# Patient Record
Sex: Female | Born: 1970 | Race: White | Hispanic: No | Marital: Married | Smoking: Never smoker
Health system: Southern US, Community
[De-identification: ages and names within clinical notes are randomized; demographics above are authoritative.]

---

## 1997-02-15 ENCOUNTER — Encounter (HOSPITAL_COMMUNITY): Admission: RE | Admit: 1997-02-15 | Discharge: 1997-03-24 | Payer: Self-pay | Admitting: Obstetrics & Gynecology

## 1997-11-21 ENCOUNTER — Emergency Department (HOSPITAL_COMMUNITY): Admission: EM | Admit: 1997-11-21 | Discharge: 1997-11-21 | Payer: Self-pay | Admitting: Emergency Medicine

## 1998-12-28 ENCOUNTER — Other Ambulatory Visit: Admission: RE | Admit: 1998-12-28 | Discharge: 1998-12-28 | Payer: Self-pay | Admitting: Obstetrics & Gynecology

## 2000-02-14 ENCOUNTER — Other Ambulatory Visit: Admission: RE | Admit: 2000-02-14 | Discharge: 2000-02-14 | Payer: Self-pay | Admitting: Obstetrics & Gynecology

## 2000-09-24 ENCOUNTER — Inpatient Hospital Stay (HOSPITAL_COMMUNITY): Admission: AD | Admit: 2000-09-24 | Discharge: 2000-09-24 | Payer: Self-pay | Admitting: Obstetrics and Gynecology

## 2000-09-30 ENCOUNTER — Other Ambulatory Visit: Admission: RE | Admit: 2000-09-30 | Discharge: 2000-09-30 | Payer: Self-pay | Admitting: Obstetrics & Gynecology

## 2001-04-18 ENCOUNTER — Inpatient Hospital Stay (HOSPITAL_COMMUNITY): Admission: AD | Admit: 2001-04-18 | Discharge: 2001-04-18 | Payer: Self-pay | Admitting: Obstetrics and Gynecology

## 2001-04-23 ENCOUNTER — Inpatient Hospital Stay (HOSPITAL_COMMUNITY): Admission: AD | Admit: 2001-04-23 | Discharge: 2001-04-25 | Payer: Self-pay | Admitting: Obstetrics and Gynecology

## 2001-06-24 ENCOUNTER — Other Ambulatory Visit: Admission: RE | Admit: 2001-06-24 | Discharge: 2001-06-24 | Payer: Self-pay | Admitting: Obstetrics & Gynecology

## 2003-11-26 ENCOUNTER — Encounter: Admission: RE | Admit: 2003-11-26 | Discharge: 2003-11-26 | Payer: Self-pay | Admitting: Internal Medicine

## 2004-04-13 ENCOUNTER — Other Ambulatory Visit: Admission: RE | Admit: 2004-04-13 | Discharge: 2004-04-13 | Payer: Self-pay | Admitting: Obstetrics & Gynecology

## 2006-12-08 ENCOUNTER — Inpatient Hospital Stay (HOSPITAL_COMMUNITY): Admission: AD | Admit: 2006-12-08 | Discharge: 2006-12-08 | Payer: Self-pay | Admitting: Obstetrics and Gynecology

## 2007-02-19 ENCOUNTER — Inpatient Hospital Stay (HOSPITAL_COMMUNITY): Admission: RE | Admit: 2007-02-19 | Discharge: 2007-02-21 | Payer: Self-pay | Admitting: Obstetrics and Gynecology

## 2008-03-12 ENCOUNTER — Ambulatory Visit: Payer: Self-pay | Admitting: Gastroenterology

## 2008-03-12 DIAGNOSIS — K589 Irritable bowel syndrome without diarrhea: Secondary | ICD-10-CM | POA: Insufficient documentation

## 2008-03-12 DIAGNOSIS — R079 Chest pain, unspecified: Secondary | ICD-10-CM | POA: Insufficient documentation

## 2008-03-12 DIAGNOSIS — R1310 Dysphagia, unspecified: Secondary | ICD-10-CM | POA: Insufficient documentation

## 2008-03-12 DIAGNOSIS — I059 Rheumatic mitral valve disease, unspecified: Secondary | ICD-10-CM | POA: Insufficient documentation

## 2008-03-19 ENCOUNTER — Ambulatory Visit: Payer: Self-pay | Admitting: Gastroenterology

## 2010-02-04 ENCOUNTER — Encounter: Payer: Self-pay | Admitting: Internal Medicine

## 2010-10-06 LAB — CBC
HCT: 33.5 — ABNORMAL LOW
HCT: 37.5
Hemoglobin: 11.6 — ABNORMAL LOW
Hemoglobin: 13.1
MCHC: 34.7
MCHC: 34.9
MCV: 89.2
MCV: 89.5
Platelets: 209
Platelets: 213
RBC: 3.74 — ABNORMAL LOW
RBC: 4.2
RDW: 14
RDW: 14.2
WBC: 10.7 — ABNORMAL HIGH
WBC: 15.5 — ABNORMAL HIGH

## 2010-10-06 LAB — RPR: RPR Ser Ql: NONREACTIVE

## 2010-10-24 LAB — FETAL FIBRONECTIN: Fetal Fibronectin: NEGATIVE

## 2014-08-02 ENCOUNTER — Encounter: Payer: Self-pay | Admitting: Gastroenterology

## 2015-12-09 ENCOUNTER — Ambulatory Visit (HOSPITAL_COMMUNITY)
Admission: EM | Admit: 2015-12-09 | Discharge: 2015-12-09 | Disposition: A | Discharge status: Home / Self Care | Payer: BLUE CROSS/BLUE SHIELD | Attending: Emergency Medicine | Admitting: Emergency Medicine

## 2015-12-09 ENCOUNTER — Ambulatory Visit (INDEPENDENT_AMBULATORY_CARE_PROVIDER_SITE_OTHER): Payer: BLUE CROSS/BLUE SHIELD

## 2015-12-09 ENCOUNTER — Encounter (HOSPITAL_COMMUNITY): Payer: Self-pay

## 2015-12-09 DIAGNOSIS — S4352XA Sprain of left acromioclavicular joint, initial encounter: Secondary | ICD-10-CM | POA: Diagnosis not present

## 2015-12-09 MED ORDER — HYDROCODONE-ACETAMINOPHEN 5-325 MG PO TABS: 1.0000 | ORAL_TABLET | ORAL | 0 refills | Status: DC | PRN

## 2015-12-09 MED ORDER — CYCLOBENZAPRINE HCL 5 MG PO TABS: 5.0000 mg | ORAL_TABLET | Freq: Three times a day (TID) | ORAL | 0 refills | Status: DC | PRN

## 2015-12-09 MED ORDER — MELOXICAM 15 MG PO TABS: 15.0000 mg | ORAL_TABLET | Freq: Every day | ORAL | 0 refills | Status: AC

## 2015-12-09 NOTE — ED Provider Notes (Signed)
[facility]    CSN: [id number] Arrival date & time: 12/09/15  1102     History   Chief Complaint Chief Complaint  Patient presents with  . Fall    HPI Janice Delgado is a 45 y.o. female.   HPI She is a 45 year old woman here for evaluation of left shoulder injury. She is playing tag football last night when she fell. She states she almost flipped over. She landed on her left shoulder. She describes a forceful stretch injury to the left lateral neck. Initially, she had pains shooting down her arm and hand, but these have resolved. She is able to move her hand and wrist without difficulty. She is unable to move her elbow or shoulder without pain. She states moving the shoulder causes a crunching sound. She reports pain in the anterior superior and posterior shoulder. She is currently wearing a sling.  History reviewed. No pertinent past medical history.  Patient Active Problem List   Diagnosis Date Noted  . MITRAL VALVE PROLAPSE 03/12/2008  . IRRITABLE BOWEL SYNDROME 03/12/2008  . CHEST PAIN 03/12/2008  . DYSPHAGIA UNSPECIFIED 03/12/2008    History reviewed. No pertinent surgical history.  OB History    No data available       Home Medications    Prior to Admission medications   Medication Sig Start Date End Date Taking? Authorizing Provider  cyclobenzaprine (FLEXERIL) 5 MG tablet Take 1 tablet (5 mg total) by mouth 3 (three) times daily as needed for muscle spasms. 12/09/15   Charm RingsErin J Milessa Hogan, MD  HYDROcodone-acetaminophen (NORCO) 5-325 MG tablet Take 1 tablet by mouth every 4 (four) hours as needed for moderate pain. 12/09/15   Charm RingsErin J Lillyana Majette, MD  meloxicam (MOBIC) 15 MG tablet Take 1 tablet (15 mg total) by mouth daily. 12/09/15   Charm RingsErin J Carola Viramontes, MD    Family History No family history on file.  Social History Social History  Substance Use Topics  . Smoking status: Never Smoker  . Smokeless tobacco: Never Used  . Alcohol use No     Allergies   Patient  has no known allergies.   Review of Systems Review of Systems As in history of present illness  Physical Exam Triage Vital Signs ED Triage Vitals  Enc Vitals Group     BP 12/09/15 1147 127/70     Pulse Rate 12/09/15 1147 87     Resp 12/09/15 1147 16     Temp 12/09/15 1147 98.4 F (36.9 C)     Temp Source 12/09/15 1147 Oral     SpO2 12/09/15 1147 100 %     Weight --      Height --      Head Circumference --      Peak Flow --      Pain Score 12/09/15 1151 6     Pain Loc --      Pain Edu? --      Excl. in [facility]? --    No data found.   Updated Vital Signs BP 127/70 (BP Location: Right Arm)   Pulse 87   Temp 98.4 F (36.9 C) (Oral)   Resp 16   LMP 11/13/2015 (Exact Date)   SpO2 100%   Visual Acuity Right Eye Distance:   Left Eye Distance:   Bilateral Distance:    Right Eye Near:   Left Eye Near:    Bilateral Near:     Physical Exam  Constitutional: She is oriented to person, place, and time.  She appears well-developed and well-nourished. No distress.  Cardiovascular: Normal rate.   Pulmonary/Chest: Effort normal.  Musculoskeletal:  Left shoulder: Exam limited due to unable to move the shoulder. She is tender at the before meals joint as well as the posterior shoulder. Sensation and motor intact distally. No bony tenderness of the humerus or elbow. No scapular tenderness.  Neurological: She is alert and oriented to person, place, and time.     UC Treatments / Results  Labs (all labs ordered are listed, but only abnormal results are displayed) Labs Reviewed - No data to display  EKG  EKG Interpretation None       Radiology Dg Shoulder Left  Result Date: 12/09/2015 CLINICAL DATA:  Pain following fall EXAM: LEFT SHOULDER - 2+ VIEW COMPARISON:  None. FINDINGS: Frontal, oblique, and Y scapular images were obtained. There is separation at the acromioclavicular joint. No frank dislocation. No fracture. The joint spaces appear unremarkable. No erosive change.  Visualized left lung is clear. IMPRESSION: There is a degree of acromioclavicular separation on the left. No frank dislocation. No fracture. No evident arthropathic change. Electronically Signed   By: Bretta BangWilliam  Woodruff III M.D.   On: 12/09/2015 13:07    Procedures Procedures (including critical care time)  Medications Ordered in UC Medications - No data to display   Initial Impression / Assessment and Plan / UC Course  I have reviewed the triage vital signs and the nursing notes.  Pertinent labs & imaging results that were available during my care of the patient were reviewed by me and considered in my medical decision making (see chart for details).  Clinical Course     Treatment with sling, meloxicam, and Flexeril. Recommended frequent ice. Discussed importance of gentle range of motion exercises. Follow-up with orthopedics if not improving next week.  Final Clinical Impressions(s) / UC Diagnoses   Final diagnoses:  Sprain of left acromioclavicular ligament, initial encounter    New Prescriptions Discharge Medication List as of 12/09/2015  1:19 PM    START taking these medications   Details  cyclobenzaprine (FLEXERIL) 5 MG tablet Take 1 tablet (5 mg total) by mouth 3 (three) times daily as needed for muscle spasms., Starting Fri 12/09/2015, Normal    HYDROcodone-acetaminophen (NORCO) 5-325 MG tablet Take 1 tablet by mouth every 4 (four) hours as needed for moderate pain., Starting Fri 12/09/2015, Print    meloxicam (MOBIC) 15 MG tablet Take 1 tablet (15 mg total) by mouth daily., Starting Fri 12/09/2015, Normal         Charm RingsErin J Del Wiseman, MD 12/09/15 1322

## 2015-12-09 NOTE — ED Triage Notes (Signed)
Pt said she fell yesterday playing football and landed on her left shoulder and now can't move her arm. Can wiggle her fingers. Did take 2 tylenol 500mg  and some old pain medication but cannot remember the name of it.

## 2015-12-09 NOTE — Discharge Instructions (Signed)
There are no broken bones in your shoulder. You have [facility] the [facility]. Continue with the sling for the next several days. Try to do gentle range of motion several times a day. Take meloxicam daily for the next week, then as needed for pain. Use the Flexeril 3 times a day as needed for muscle tightness. Use Vicodin every 4 hours as needed for severe pain. Do not drive while taking this medicine. Follow-up with Dr. Roda ShuttersXu, the orthopedic doctor, if not improving next week.

## 2015-12-12 ENCOUNTER — Encounter (INDEPENDENT_AMBULATORY_CARE_PROVIDER_SITE_OTHER): Payer: Self-pay | Admitting: Orthopaedic Surgery

## 2015-12-12 ENCOUNTER — Ambulatory Visit (INDEPENDENT_AMBULATORY_CARE_PROVIDER_SITE_OTHER): Payer: BLUE CROSS/BLUE SHIELD | Admitting: Orthopaedic Surgery

## 2015-12-12 DIAGNOSIS — S43102A Unspecified dislocation of left acromioclavicular joint, initial encounter: Secondary | ICD-10-CM | POA: Diagnosis not present

## 2015-12-12 MED ORDER — CYCLOBENZAPRINE HCL 5 MG PO TABS: 5.0000 mg | ORAL_TABLET | Freq: Three times a day (TID) | ORAL | 0 refills | Status: DC | PRN

## 2015-12-12 NOTE — Progress Notes (Signed)
Office Visit Note   Patient: Janice Delgado           Date of Birth: [birth year]           MRN: [id number] Visit Date: 12/12/2015              Requested by: No referring provider defined for this encounter. PCP: Tarri FullerESCAJEDA, RICHARD, MD   Assessment & Plan: Visit Diagnoses:  1. Separation of left acromioclavicular joint, type 2, initial encounter     Plan: Impression is grade 2 left acromioclavicular separation. Sling for 3-4 weeks and then wean as tolerated. Follow-up in 3 weeks for clinical recheck. Anticipate beginning physical therapy at that time.  Follow-Up Instructions: Return in about 3 weeks (around 01/02/2016) for recheck left AC separation.   Orders:  No orders of the defined types were placed in this encounter.  Meds ordered this encounter  Medications  . cyclobenzaprine (FLEXERIL) 5 MG tablet    Sig: Take 1 tablet (5 mg total) by mouth 3 (three) times daily as needed for muscle spasms.    Dispense:  30 tablet    Refill:  0      Procedures: No procedures performed   Clinical Data: No additional findings.   Subjective: Chief Complaint  Patient presents with  . Left Shoulder - Pain, Injury    HPI Patient comes in today for left shoulder pain as a result of a fall onto her left shoulder 12/08/2015. She was running at time when she lost her balance. She has some pain that radiates up into the neck and into the elbow but mainly the pain is concentrated over the left before meals joint. Pain is worse with movement of the shoulder. She is wearing a sling. She endorses some numbness and a cortical not over the left shoulder. She is left-handed. Review of Systems Complete review of systems negative except for history of present illness  Objective: Vital Signs: LMP 11/13/2015 (Exact Date)   Physical Exam Well-developed well-nourished no acute distress alert and 3 nonlabored breathing normal judgments affect abdomen soft no lymphadenopathy Ortho Exam Exam of  the left shoulder shows a tender a acromioclavicular joint with slight prominence of the distal clavicle. Range of motion and rotator cuff testing attempted but unable to be performed secondary to guarding and pain. She is neurovascularly intact distally Specialty Comments:  No specialty comments available.  Imaging: Dg Shoulder Left  Result Date: 12/09/2015 CLINICAL DATA:  Pain following fall EXAM: LEFT SHOULDER - 2+ VIEW COMPARISON:  None. FINDINGS: Frontal, oblique, and Y scapular images were obtained. There is separation at the acromioclavicular joint. No frank dislocation. No fracture. The joint spaces appear unremarkable. No erosive change. Visualized left lung is clear. IMPRESSION: There is a degree of acromioclavicular separation on the left. No frank dislocation. No fracture. No evident arthropathic change. Electronically Signed   By: Bretta BangWilliam  Woodruff III M.D.   On: 12/09/2015 13:07     PMFS History: Patient Active Problem List   Diagnosis Date Noted  . 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 MITRAL VALVE PROLAPSE 03/12/2008  . IRRITABLE BOWEL SYNDROME 03/12/2008  . CHEST PAIN 03/12/2008  . DYSPHAGIA UNSPECIFIED 03/12/2008   No past medical history on file.  No family history on file.  No past surgical history on file. Social History   Occupational History  . Not on file.   Social History Main Topics  . Smoking status: Never Smoker  . Smokeless tobacco: Never Used  .  Alcohol use No  . Drug use: No  . Sexual activity: Not on file

## 2016-01-02 ENCOUNTER — Ambulatory Visit (INDEPENDENT_AMBULATORY_CARE_PROVIDER_SITE_OTHER): Payer: BLUE CROSS/BLUE SHIELD | Admitting: Orthopaedic Surgery

## 2016-01-02 ENCOUNTER — Encounter (INDEPENDENT_AMBULATORY_CARE_PROVIDER_SITE_OTHER): Payer: Self-pay | Admitting: Orthopaedic Surgery

## 2016-01-02 DIAGNOSIS — S43102A Unspecified dislocation of left acromioclavicular joint, initial encounter: Secondary | ICD-10-CM

## 2016-01-02 MED ORDER — METHOCARBAMOL 500 MG PO TABS: 500.0000 mg | ORAL_TABLET | Freq: Four times a day (QID) | ORAL | 2 refills | Status: AC | PRN

## 2016-01-02 NOTE — Progress Notes (Signed)
Patient follows up today for her type II acromioclavicular separation. She is doing a lot better. She does have anxiety about moving her shoulder and wants to make sure that this is okay. Physical exam shows that she does have an intact rotator cuff and function but with significant guarding and hesitation in pushing herself through the pain. She endorses referred pain to the medial clavicle and scapula. Reassurances given today. At this point will start physical therapy to mobilize and strengthen her shoulder. Follow-up as needed.

## 2016-01-19 ENCOUNTER — Ambulatory Visit: Payer: BLUE CROSS/BLUE SHIELD | Admitting: Physical Therapy

## 2016-01-26 ENCOUNTER — Ambulatory Visit: Payer: BLUE CROSS/BLUE SHIELD | Admitting: Physical Therapy

## 2016-01-27 ENCOUNTER — Telehealth (INDEPENDENT_AMBULATORY_CARE_PROVIDER_SITE_OTHER): Payer: Self-pay | Admitting: Orthopaedic Surgery

## 2016-01-27 NOTE — Telephone Encounter (Signed)
yes

## 2016-01-27 NOTE — Telephone Encounter (Signed)
Patient called advised she was referred to [facility] [place]. Her insurance is billing the rehab as if this is a hospital rehab which is making her co-pay and deductible higher. Patient asked if she can be referred to [facility] on [address].  [facility] bill as a specialist making her co-pay and deductible much lower. The number to contact her is  [phone] or [phone] Cell

## 2016-01-27 NOTE — Telephone Encounter (Signed)
Please advise 

## 2016-01-30 NOTE — Telephone Encounter (Signed)
Order faxed.

## 2016-02-24 ENCOUNTER — Other Ambulatory Visit: Payer: Self-pay | Admitting: Orthopedic Surgery

## 2016-02-24 DIAGNOSIS — M7500 Adhesive capsulitis of unspecified shoulder: Secondary | ICD-10-CM

## 2016-03-01 ENCOUNTER — Inpatient Hospital Stay
Admission: RE | Admit: 2016-03-01 | Discharge: 2016-03-01 | Disposition: A | Discharge status: Home / Self Care | Payer: BLUE CROSS/BLUE SHIELD | Source: Ambulatory Visit | Attending: Orthopedic Surgery | Admitting: Orthopedic Surgery

## 2016-03-06 ENCOUNTER — Ambulatory Visit
Admission: RE | Admit: 2016-03-06 | Discharge: 2016-03-06 | Disposition: A | Discharge status: Home / Self Care | Payer: BLUE CROSS/BLUE SHIELD | Source: Ambulatory Visit | Attending: Orthopedic Surgery | Admitting: Orthopedic Surgery

## 2016-03-06 DIAGNOSIS — M7500 Adhesive capsulitis of unspecified shoulder: Secondary | ICD-10-CM

## 2018-07-10 IMAGING — MR MR SHOULDER*L* W/O CM
4 of 5 series · 24 of 40 positions shown · non-contrast
Comparison: Radiographs 12/09/2015.

CLINICAL DATA: 45-year-old with left shoulder pain, weakness and
immobility for 3 months after falling. No previous relevant surgery.

EXAM:
MRI OF THE LEFT SHOULDER WITHOUT CONTRAST
TECHNIQUE: Multiplanar, multisequence MR imaging of the shoulder was performed.
No intravenous contrast was administered.

[Series 3: T2 fat-sat · axial · 4.0mm · 0.55mm/px · z∈[-56,+28]mm · 8 of 20 slices shown (1 of 2)]
[im 1/20]
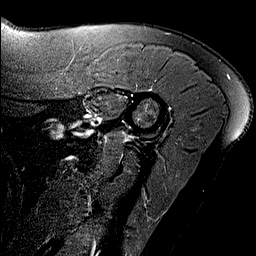
[im 3/20]
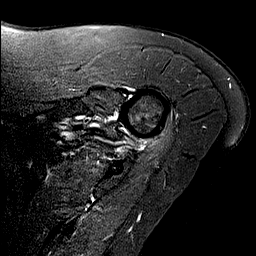
[im 6/20]
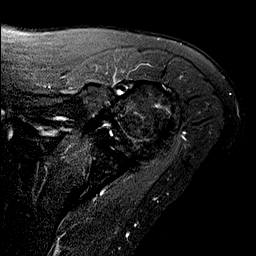
[im 9/20]
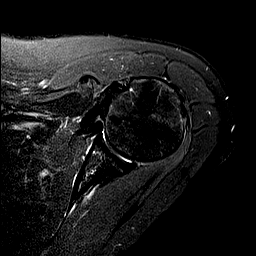
[im 11/20]
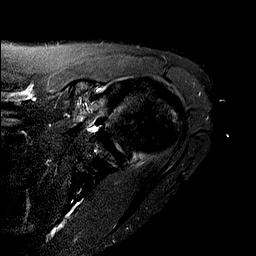
[im 14/20]
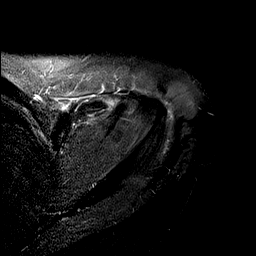
[im 17/20]
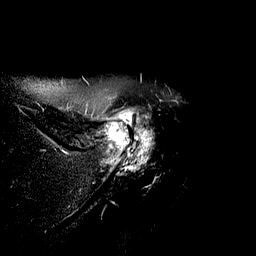
[im 20/20]
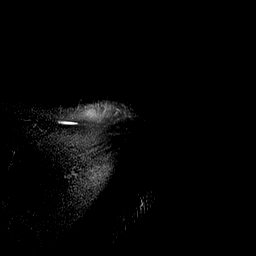

[Series 4: T2 fat-sat · oblique · 4.0mm · 0.55mm/px · 6 of 20 slices shown (2 of 2)]
[im 1/20]
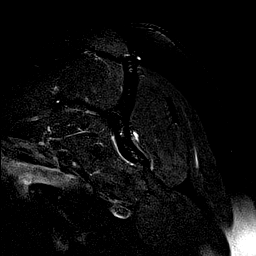
[im 3/20]
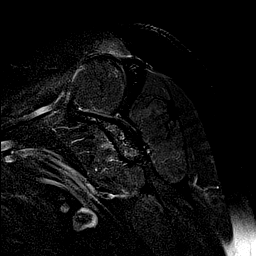
[im 5/20]
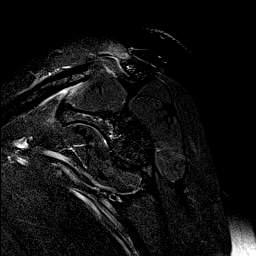
[im 8/20]
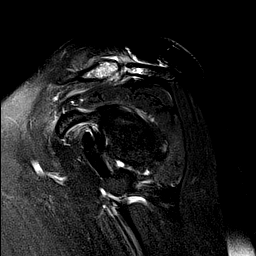
[im 10/20]
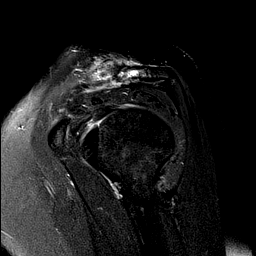
[im 17/20]
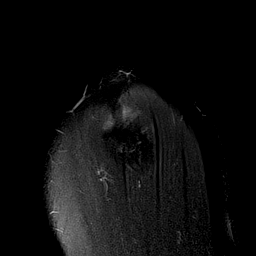

[Series 5: T1 · oblique · 4.0mm · 0.22mm/px · 3 of 20 slices shown]
[im 3/20]
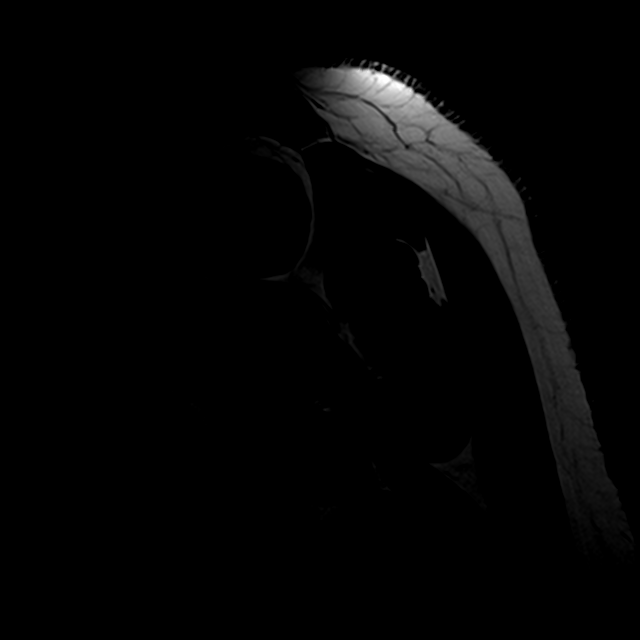
[im 10/20]
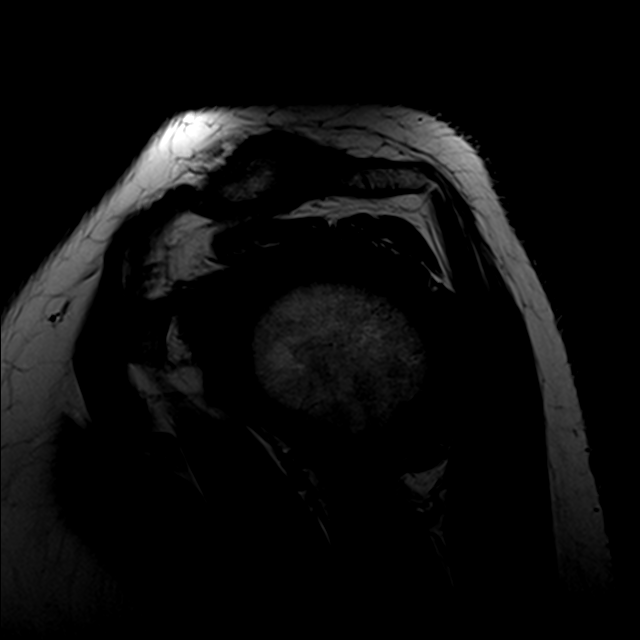
[im 17/20]
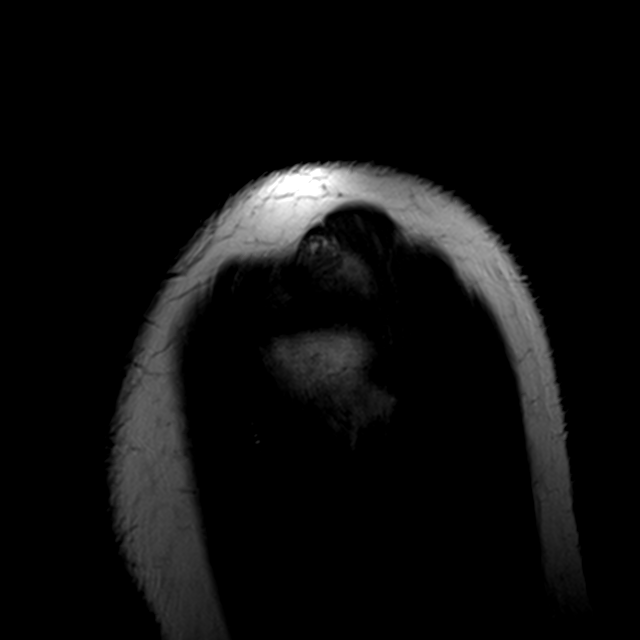

[Series 9: PD · oblique · 4.0mm · 0.22mm/px · 7 of 17 slices shown]
[im 1/17]
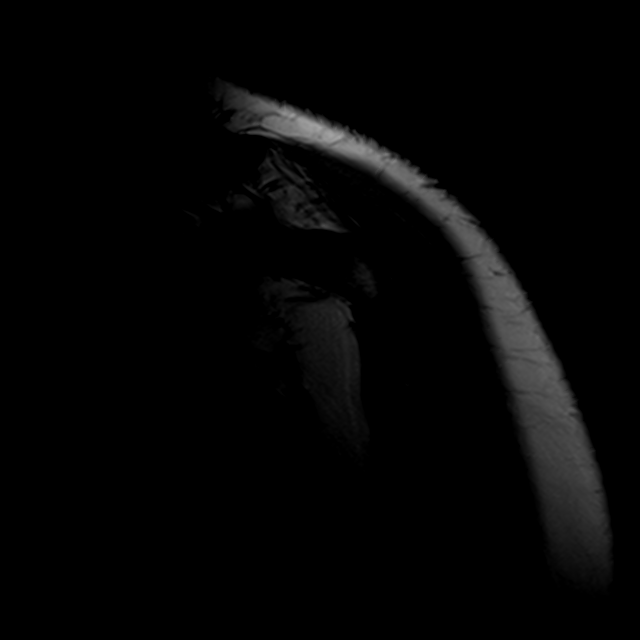
[im 3/17]
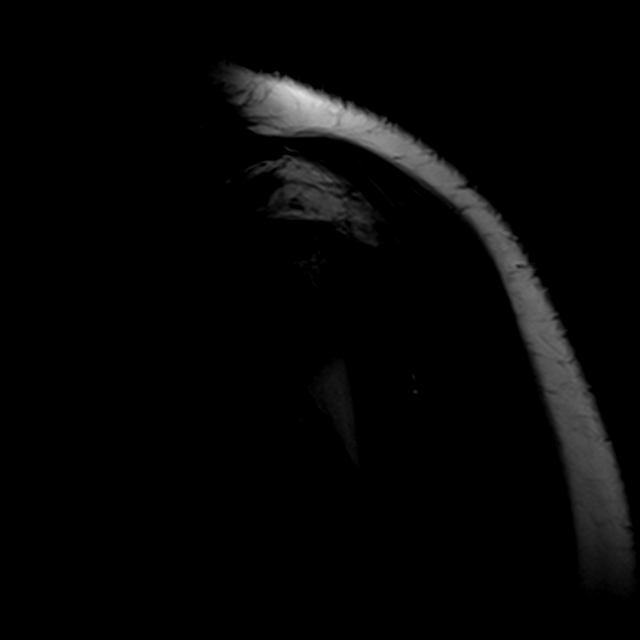
[im 6/17]
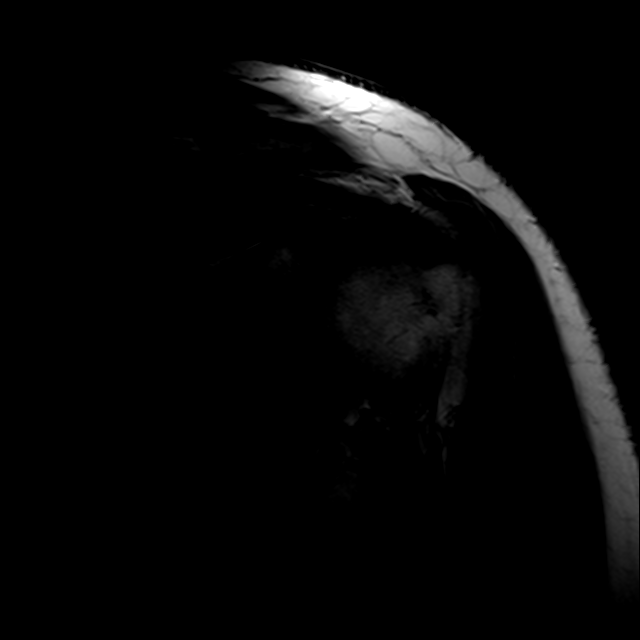
[im 9/17]
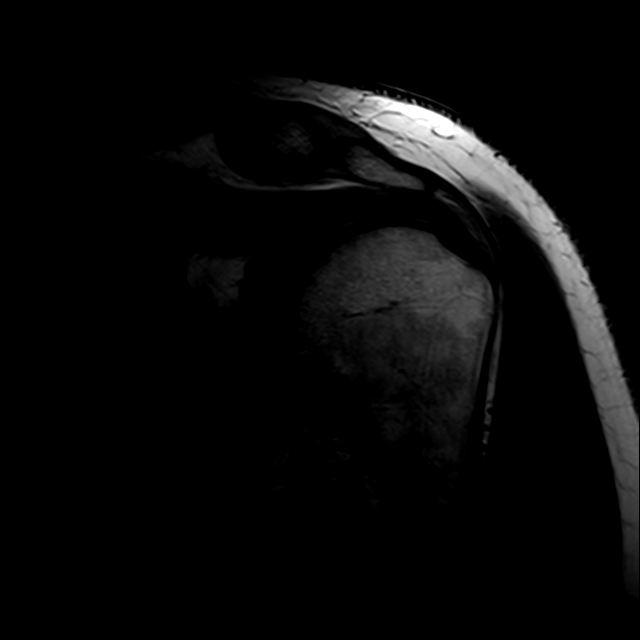
[im 11/17]
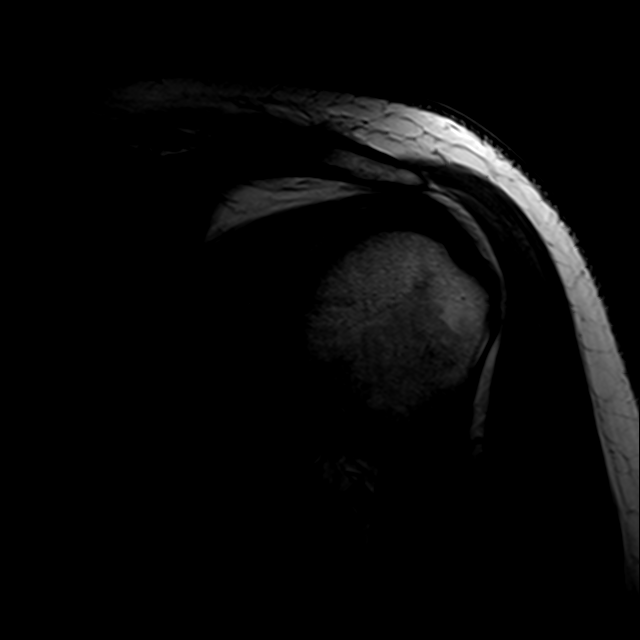
[im 14/17]
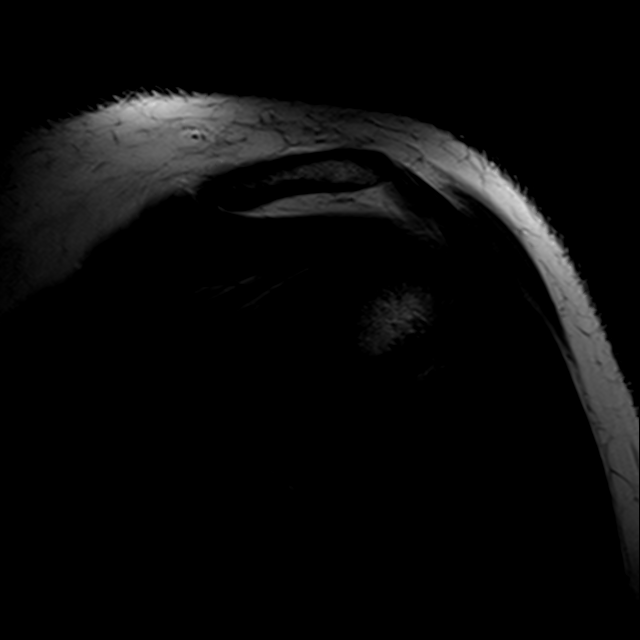
[im 17/17]
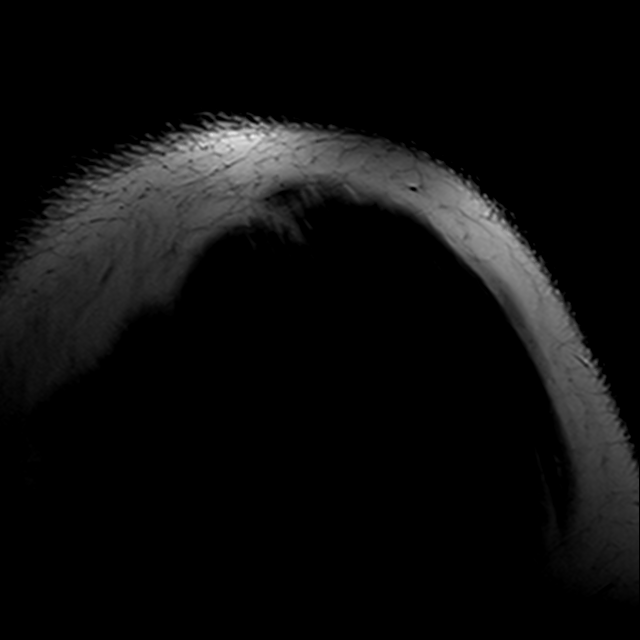

[24 of 40 positions shown; findings below may reference images not displayed]

FINDINGS: Rotator cuff:  Intact without significant tendinosis.

Muscles:  No focal muscular atrophy or edema.

Biceps long head:  Intact and normally positioned.

Acromioclavicular Joint: The acromion is type 1 although
demonstrates anterior and lateral downsloping. There are moderate
acromioclavicular degenerative changes with capsular thickening and
prominent bone marrow edema in the distal clavicle and adjacent
acromion. There is no widening of the AC joint or definite
osteolysis. There is some edema around the clavicular side of the
coracoclavicular ligament. A small amount of fluid is present in the
subacromial -subdeltoid bursa.

Glenohumeral Joint: No significant shoulder joint effusion or
glenohumeral arthropathy.

Labrum: Suspected variant of the superior labrum with a thickened
middle glenohumeral ligament. The superior labrum appears
degenerated on the coronal images, although no discrete labral tear
or paralabral cyst identified.

Bones: No acute osseous findings. There is mild subcortical cyst
formation posteriorly in the humeral head near the teres minor
insertion.

Other: No significant soft tissue findings.
IMPRESSION: 1. The primary abnormality is acromioclavicular joint arthropathy
with marrow edema, capsular thickening and adjacent mild subacromial
-subdeltoid bursal fluid. These findings could be secondary to
healing AC joint injury as correlated with prior radiographs. No
residual joint widening or disruption of the coracoclavicular
ligaments identified.
2. The rotator cuff and biceps tendon appear normal.
3. Suspected Manzanares complex and mild superior labral degeneration.

## 2021-03-09 ENCOUNTER — Emergency Department (HOSPITAL_BASED_OUTPATIENT_CLINIC_OR_DEPARTMENT_OTHER): Payer: BLUE CROSS/BLUE SHIELD

## 2021-03-09 ENCOUNTER — Emergency Department (HOSPITAL_BASED_OUTPATIENT_CLINIC_OR_DEPARTMENT_OTHER)
Admission: EM | Admit: 2021-03-09 | Discharge: 2021-03-09 | Disposition: A | Discharge status: Home / Self Care | Payer: BLUE CROSS/BLUE SHIELD | Attending: Emergency Medicine | Admitting: Emergency Medicine

## 2021-03-09 ENCOUNTER — Encounter (HOSPITAL_BASED_OUTPATIENT_CLINIC_OR_DEPARTMENT_OTHER): Payer: Self-pay

## 2021-03-09 ENCOUNTER — Other Ambulatory Visit (HOSPITAL_BASED_OUTPATIENT_CLINIC_OR_DEPARTMENT_OTHER): Payer: Self-pay

## 2021-03-09 ENCOUNTER — Emergency Department (HOSPITAL_BASED_OUTPATIENT_CLINIC_OR_DEPARTMENT_OTHER): Payer: BLUE CROSS/BLUE SHIELD | Admitting: Radiology

## 2021-03-09 ENCOUNTER — Other Ambulatory Visit: Payer: Self-pay

## 2021-03-09 DIAGNOSIS — M542 Cervicalgia: Secondary | ICD-10-CM | POA: Diagnosis not present

## 2021-03-09 DIAGNOSIS — S62635A Displaced fracture of distal phalanx of left ring finger, initial encounter for closed fracture: Secondary | ICD-10-CM | POA: Insufficient documentation

## 2021-03-09 DIAGNOSIS — S022XXB Fracture of nasal bones, initial encounter for open fracture: Secondary | ICD-10-CM | POA: Diagnosis not present

## 2021-03-09 DIAGNOSIS — S0121XA Laceration without foreign body of nose, initial encounter: Secondary | ICD-10-CM | POA: Insufficient documentation

## 2021-03-09 DIAGNOSIS — S0992XA Unspecified injury of nose, initial encounter: Secondary | ICD-10-CM | POA: Diagnosis present

## 2021-03-09 DIAGNOSIS — S0081XA Abrasion of other part of head, initial encounter: Secondary | ICD-10-CM | POA: Diagnosis not present

## 2021-03-09 DIAGNOSIS — S00531A Contusion of lip, initial encounter: Secondary | ICD-10-CM | POA: Insufficient documentation

## 2021-03-09 DIAGNOSIS — W01198A Fall on same level from slipping, tripping and stumbling with subsequent striking against other object, initial encounter: Secondary | ICD-10-CM | POA: Insufficient documentation

## 2021-03-09 DIAGNOSIS — Z23 Encounter for immunization: Secondary | ICD-10-CM | POA: Insufficient documentation

## 2021-03-09 MED ORDER — OXYCODONE-ACETAMINOPHEN 5-325 MG PO TABS
1.0000 | ORAL_TABLET | Freq: Four times a day (QID) | ORAL | 0 refills | Status: AC | PRN
  Filled 2021-03-09: qty 10, 3d supply, fill #0

## 2021-03-09 MED ORDER — AMOXICILLIN-POT CLAVULANATE 875-125 MG PO TABS
1.0000 | ORAL_TABLET | Freq: Two times a day (BID) | ORAL | 0 refills | Status: AC
  Filled 2021-03-09: qty 14, 7d supply, fill #0

## 2021-03-09 MED ORDER — OXYCODONE-ACETAMINOPHEN 5-325 MG PO TABS
1.0000 | ORAL_TABLET | Freq: Once | ORAL | Status: AC
  Administered 2021-03-09: 1 via ORAL
  Filled 2021-03-09: qty 1

## 2021-03-09 MED ORDER — TETANUS-DIPHTH-ACELL PERTUSSIS 5-2.5-18.5 LF-MCG/0.5 IM SUSY
0.5000 mL | PREFILLED_SYRINGE | Freq: Once | INTRAMUSCULAR | Status: AC
  Administered 2021-03-09: 0.5 mL via INTRAMUSCULAR
  Filled 2021-03-09: qty 0.5

## 2021-03-09 NOTE — ED Triage Notes (Signed)
Pt BIB [facility] from home, pt was putting a shelf together, leaned onto shelf to grab something when the shelf gave way causing pt to fall forward onto the wire section of the shelf. Pt w/swelling to nose and bottom lip and abrasion to forehead, nose and lip. Denies LOC, neck/back pain, N/V. Does not take any blood thinners.   Pt with a SBP of 82-150 HR 90 RR 20 98% RA   Pt received Fentanyl and 4mg  Zofran, 500cc NS    20g LAC

## 2021-03-09 NOTE — ED Provider Notes (Signed)
[facility] EMERGENCY DEPT Provider Note   CSN: [id number] Arrival date & time: 03/09/21  1142     History  Chief Complaint  Patient presents with   Fall   Facial Injury    Janice Delgado is a 51 y.o. female.  The history is provided by the patient and medical records. No language interpreter was used.  Fall  Facial Injury  51 year old female brought here via EMS from home for evaluation of facial injury.  Patient reports approximately an hour ago she was working on a [employer] trader when it felt causing her to fall with it.  Patient states she fell on top of the metal rack and struck her face against the mesh.  She also mention that her left ring finger was caught in the rack causing pain.  Pain is described as a sharp sensation across her face with some tingling sensation.  She also having trouble breathing through her nose.  She also complaining of throbbing pain to left ring finger.  She endorses minimal neck tenderness denies chest pain or abdominal pain no hip pain or knee pain.  She is not up-to-date with tetanus.  She denies any loss of consciousness.  She did receive pain medication via EMS which did provide some relief.  She denies any loss of consciousness, confusion.  Initially felt a bit nauseous but that has since resolved.  She is not on any blood thinner medication.  Home Medications Prior to Admission medications   Medication Sig Start Date End Date Taking? Authorizing Provider  cyclobenzaprine (FLEXERIL) 5 MG tablet Take 1 tablet (5 mg total) by mouth 3 (three) times daily as needed for muscle spasms. Patient not taking: Reported on 01/02/2016 12/12/15   Tarry Kos, MD  HYDROcodone-acetaminophen (NORCO) 5-325 MG tablet Take 1 tablet by mouth every 4 (four) hours as needed for moderate pain. Patient not taking: Reported on 01/02/2016 12/09/15   Charm Rings, MD  meloxicam (MOBIC) 15 MG tablet Take 1 tablet (15 mg total) by mouth daily.  12/09/15   Charm Rings, MD  methocarbamol (ROBAXIN) 500 MG tablet Take 1 tablet (500 mg total) by mouth every 6 (six) hours as needed for muscle spasms. 01/02/16   Tarry Kos, MD      Allergies    Patient has no known allergies.    Review of Systems   Review of Systems  All other systems reviewed and are negative.  Physical Exam Updated Vital Signs BP 110/74    Pulse 91    Temp (!) 97.4 F (36.3 C)    Resp 16    Ht 5\' 5"  (1.651 m)    Wt 68 kg    SpO2 97%    BMI 24.96 kg/m  Physical Exam Vitals and nursing note reviewed.  Constitutional:      General: She is not in acute distress.    Appearance: She is well-developed.  HENT:     Head: Normocephalic.     Comments: Face: Abrasion noted to mid forehead.  Ecchymotic nose with skin tear at the bridge of nose not actively bleeding.  Dried blood noted to bilateral nares without evidence of septal hematoma or deviated septum.  No hemotympanums.  Ecchymosis noted to upper and lower lip without laceration noted.  No malocclusion, normal dentition.  No significant midface tenderness. Eyes:     Conjunctiva/sclera: Conjunctivae normal.  Neck:     Comments: No significant midline cervical spine tenderness. Cardiovascular:  Rate and Rhythm: Normal rate and regular rhythm.  Pulmonary:     Effort: Pulmonary effort is normal.  Abdominal:     Palpations: Abdomen is soft.     Tenderness: There is no abdominal tenderness.  Musculoskeletal:        General: Tenderness (Left hand: Tenderness to the distal phalanx and the PIP of fourth finger with swelling noted but no deformity.  Brisk cap refill.) present.     Cervical back: Normal range of motion and neck supple. No tenderness.  Skin:    Findings: No rash.  Neurological:     Mental Status: She is alert and oriented to person, place, and time.  Psychiatric:        Mood and Affect: Mood normal.    ED Results / Procedures / Treatments   Labs (all labs ordered are listed, but only  abnormal results are displayed) Labs Reviewed - No data to display  EKG None  Radiology DG Hand Complete Left  Result Date: 03/09/2021 CLINICAL DATA:  Left ring finger injury EXAM: LEFT HAND - COMPLETE 3+ VIEW COMPARISON:  None. FINDINGS: Minimally displaced fracture of the base of the distal phalanx of the fourth digit, seen best on the lateral view. IMPRESSION: Minimally displaced fracture of the base of the distal phalanx of the fourth digit, extending to the interphalangeal joint Electronically Signed   By: Acquanetta Belling M.D.   On: 03/09/2021 14:15   CT Maxillofacial Wo Contrast  Result Date: 03/09/2021 CLINICAL DATA:  Swelling over the bridge of the nose. EXAM: CT MAXILLOFACIAL WITHOUT CONTRAST TECHNIQUE: Multidetector CT imaging of the maxillofacial structures was performed. Multiplanar CT image reconstructions were also generated. RADIATION DOSE REDUCTION: This exam was performed according to the departmental dose-optimization program which includes automated exposure control, adjustment of the mA and/or kV according to patient size and/or use of iterative reconstruction technique. COMPARISON:  None. FINDINGS: Osseous: No mandibular dislocation. Minimally depressed fracture of the right nasal bone. No other fracture. Nasal septum is intact. Orbits: Negative. No traumatic or inflammatory finding. Sinuses: Clear. Soft tissues: No fluid collection or hematoma. Limited intracranial: No significant or unexpected finding. IMPRESSION: 1. Minimally depressed fracture of the right nasal bone. Electronically Signed   By: Elige Ko M.D.   On: 03/09/2021 12:55    Procedures Procedures    Medications Ordered in ED Medications  Tdap (BOOSTRIX) injection 0.5 mL (0.5 mLs Intramuscular Given 03/09/21 1343)  oxyCODONE-acetaminophen (PERCOCET/ROXICET) 5-325 MG per tablet 1 tablet (1 tablet Oral Given 03/09/21 1341)    ED Course/ Medical Decision Making/ A&P                           Medical Decision  Making Amount and/or Complexity of Data Reviewed Radiology: ordered.   BP 124/80    Pulse 84    Temp (!) 97.4 F (36.3 C)    Resp 16    Ht 5\' 5"  (1.651 m)    Wt 68 kg    SpO2 100%    BMI 24.96 kg/m   1:32 PM Patient had a mechanical fall when she fell on top of a mesh metal rack prior to arrival.  She struck her face against the metal rack and suffered facial injury.  She has multiple abrasion across the bridge of her nose as well as bruising along her nose and her lips.  And no deep laceration requiring lack repair at this time.  There are dried blood noted in  her nares but no evidence of septal hematoma or septal deviation.  She is mentating appropriately.  Mild tenderness to her cervical paraspinal muscle without significant midline spine tenderness low suspicion for cervical spine fracture.  She has tenderness along her left ring finger.  We will update her tetanus.  And provide medication to help her with her pain.  Will provide wound care as appropriate.  Maxillofacial CT scan obtained and independently review and interpreted by me which demonstrated mildly depressed right nasal bone fracture with intact nasal septum.  This is considered an open injury therefore I will prescribe pain medication as well as antibiotic and will give patient referral to ENT for outpatient follow-up.  2:53 PM Patient also injured her left ring finger.  An x-ray of the left hand was obtained which demonstrated a minimally displaced fracture at the base of the distal phalanx of the fourth digit extending to the interphalangeal joint.  This x-ray was independent review interpreted by me.  This is a closed injury, patient is neurovascular intact, finger splint applied and will give referral to hand specialist for outpatient follow-up.  Time was spent discussing appropriate wound care management and outpatient follow-up for her injuries.  Return precaution given.  Patient was also given opiate pain medication for pain  control here.  On reassessment patient reported feeling better.        Final Clinical Impression(s) / ED Diagnoses Final diagnoses:  Open fracture of nasal bone, initial encounter  Closed displaced fracture of distal phalanx of left ring finger, initial encounter  Abrasion of face, initial encounter    Rx / DC Orders ED Discharge Orders          Ordered    oxyCODONE-acetaminophen (PERCOCET) 5-325 MG tablet  Every 6 hours PRN        03/09/21 1501    amoxicillin-clavulanate (AUGMENTIN) 875-125 MG tablet  Every 12 hours        03/09/21 1501              Fayrene Helper, PA-C 03/09/21 1507    Lorre Nick, MD 03/13/21 1015

## 2021-03-09 NOTE — Discharge Instructions (Addendum)
You have been evaluated for your fall.  CT scan demonstrate a broken bone on the right side of your nose.  This injury will likely heal on his own however you may call and follow-up closely with ear nose and throat specialist, Dr. Pollyann Kennedy, for outpatient management.  Furthermore, x-ray of your left ring finger did demonstrate a broken bone.  Wear finger splint for protection and follow-up with hand specialist sometime next week as needed for further care.  Take Percocet as needed for pain but be aware that it can cause drowsiness therefore avoid operating heavy machinery or driving while taking pain medication.  Take antibiotic to decrease risks of infection.

## 2023-07-13 IMAGING — DX DG HAND COMPLETE 3+V*L*
3 series · 3 of 3 positions shown · non-contrast
Comparison: None.

CLINICAL DATA: Left ring finger injury

EXAM:
LEFT HAND - COMPLETE 3+ VIEW

[hand ap]
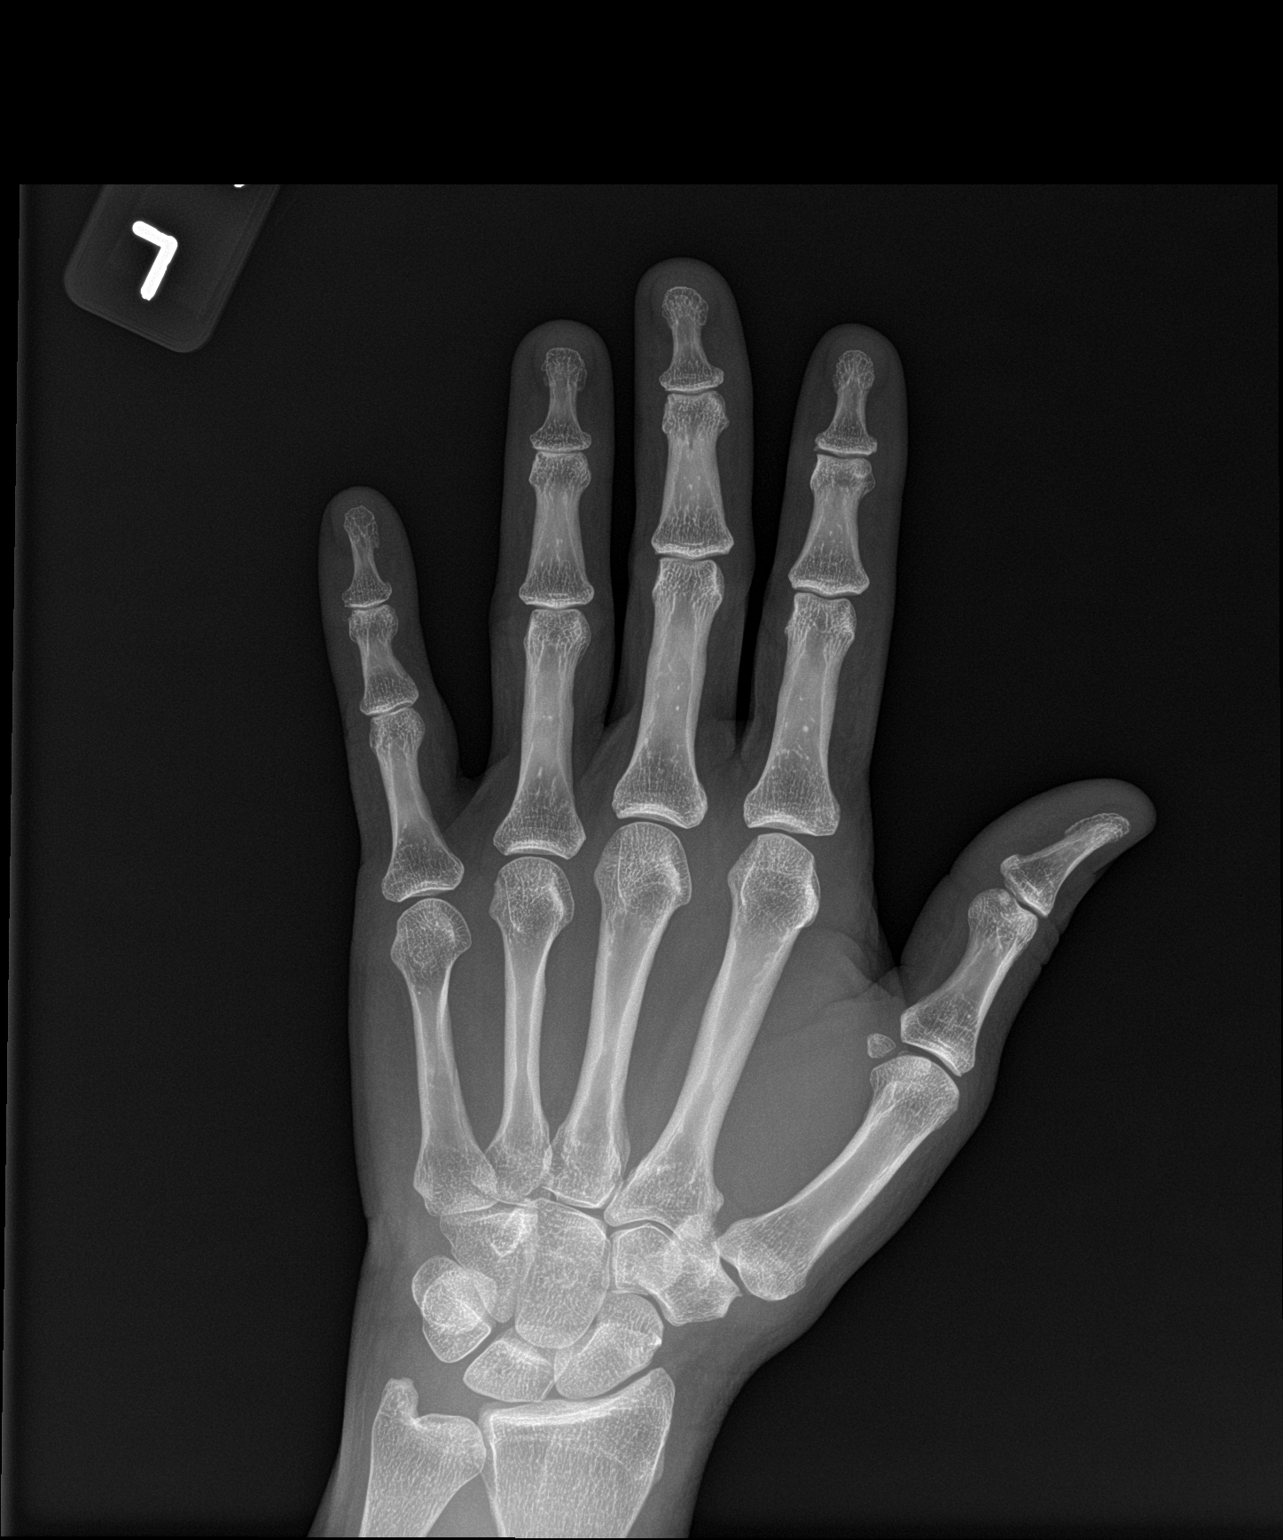

[hand obl]
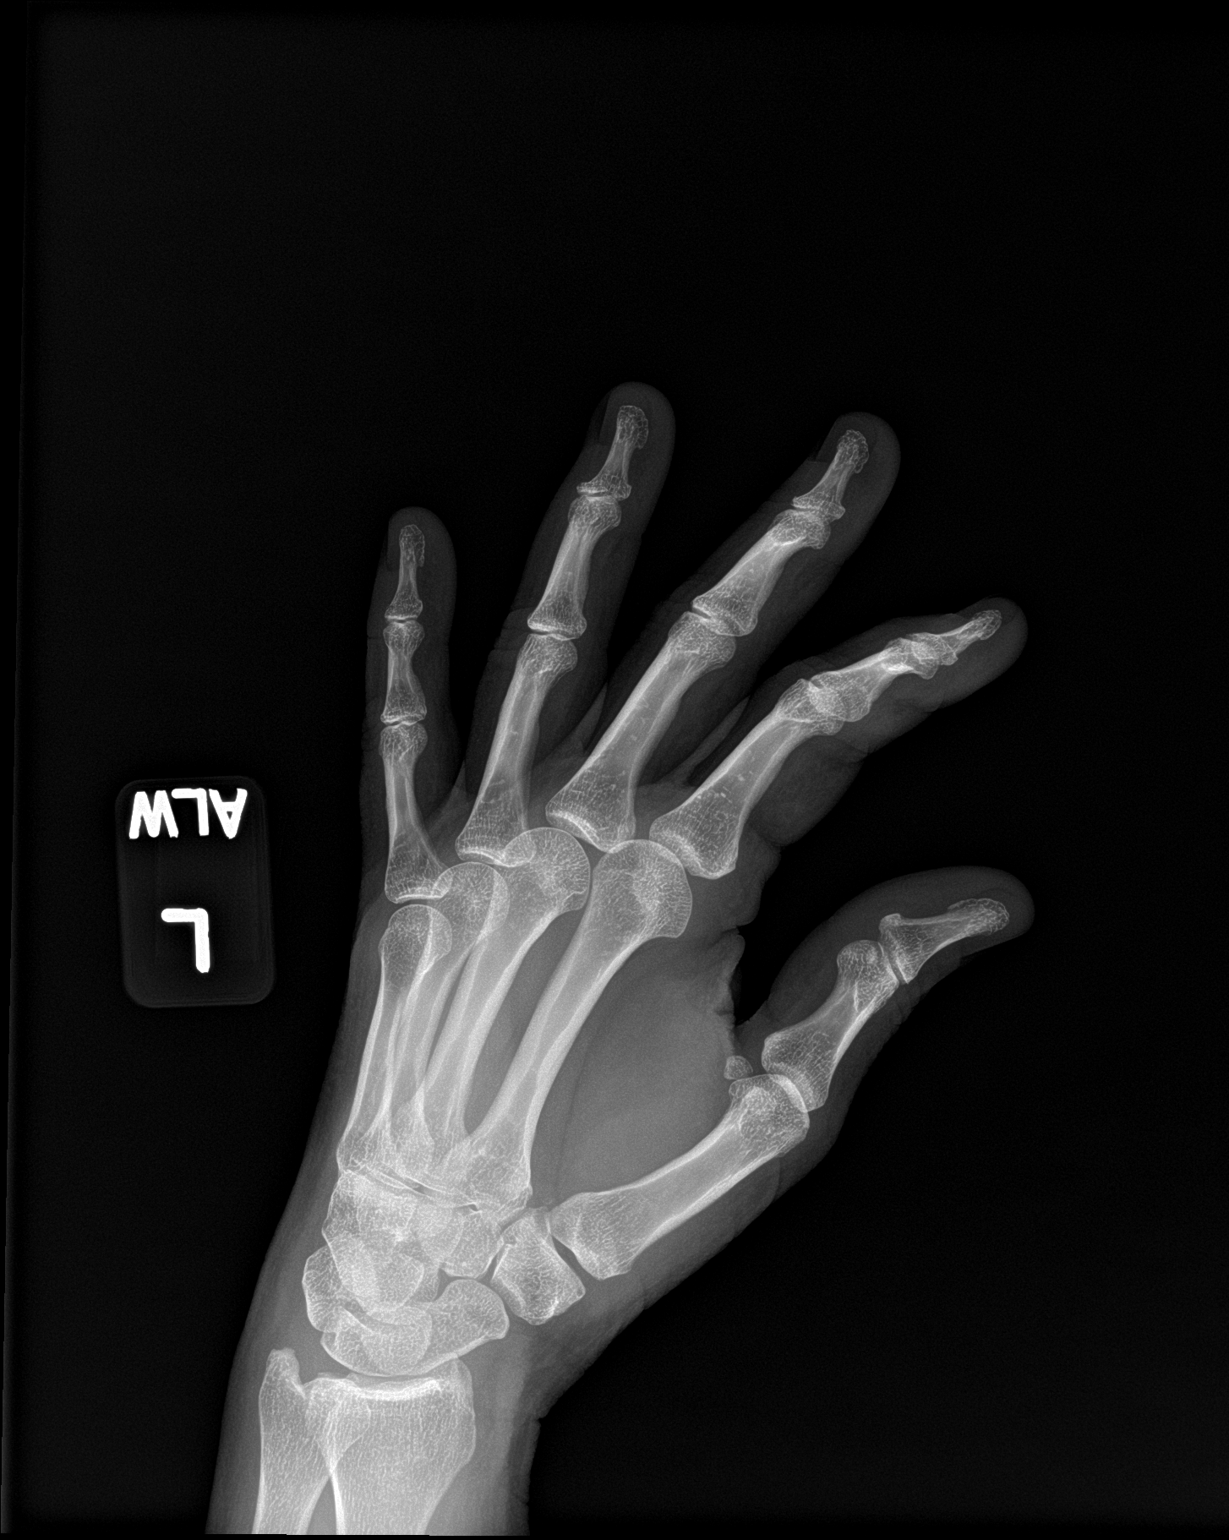

[hand lat]
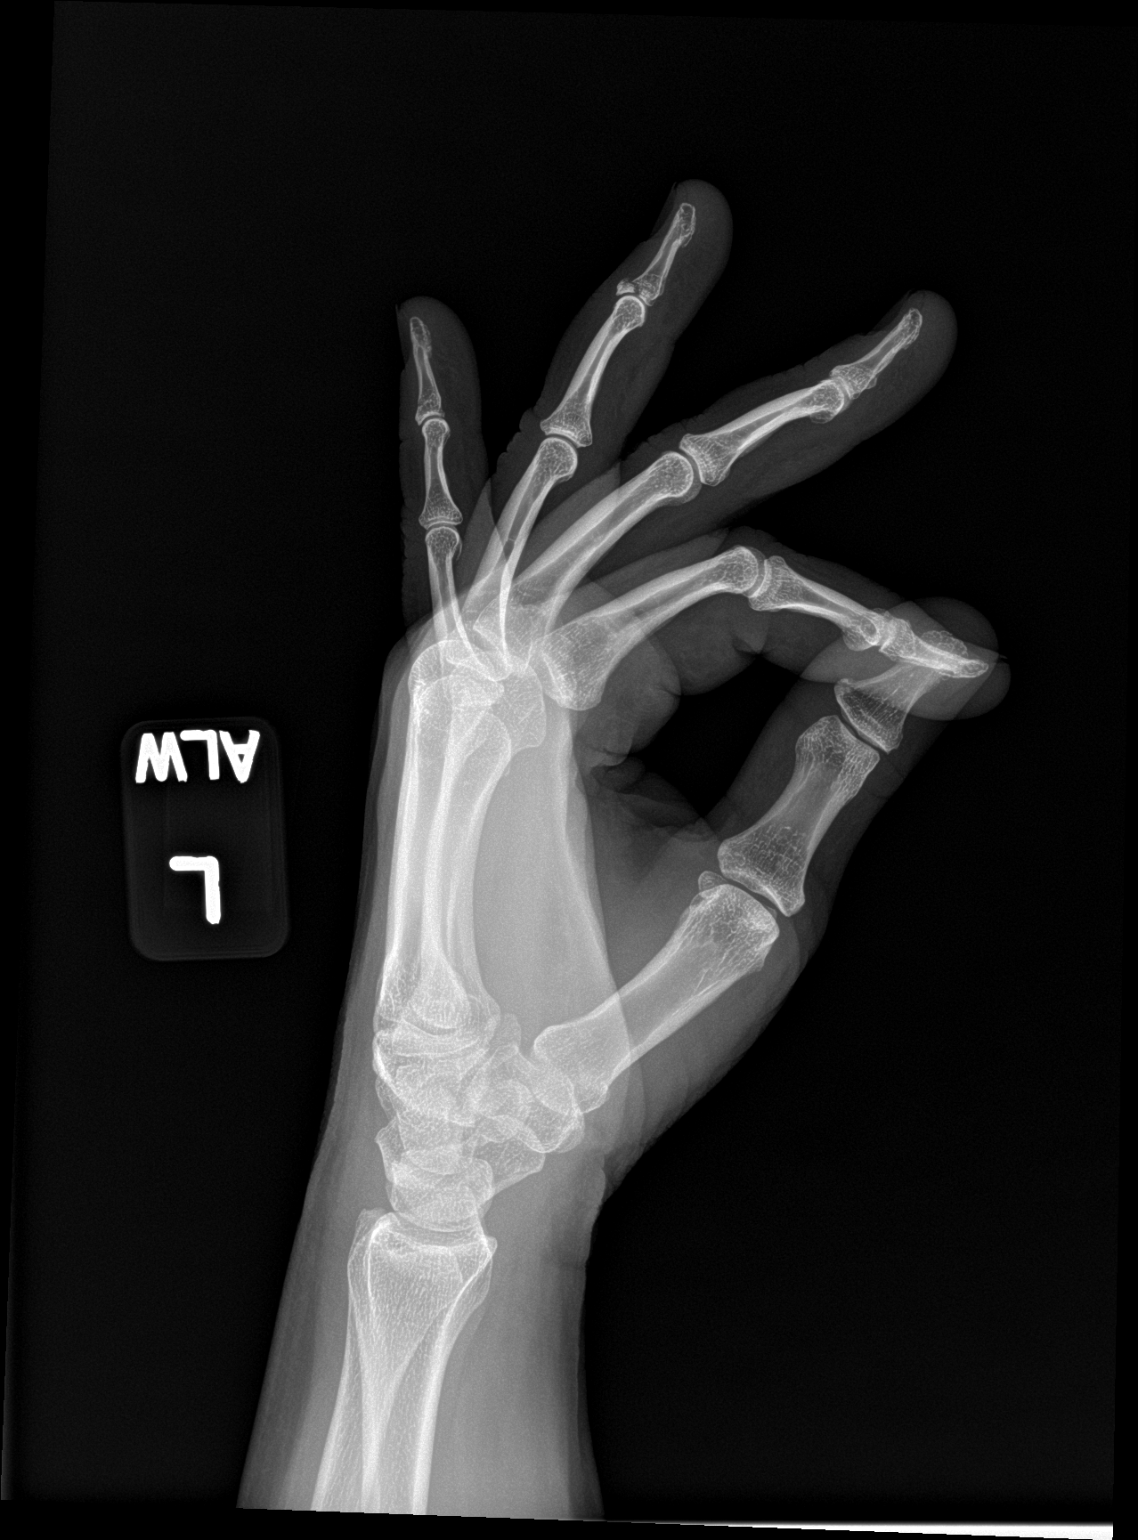

[3 of 3 positions shown; findings below may reference images not displayed]

FINDINGS: Minimally displaced fracture of the base of the distal phalanx of
the fourth digit, seen best on the lateral view.
IMPRESSION: Minimally displaced fracture of the base of the distal phalanx of
the fourth digit, extending to the interphalangeal joint
